# Patient Record
Sex: Male | Born: 1965 | Hispanic: Yes | Marital: Married | Smoking: Never smoker
Health system: Southern US, Community
[De-identification: ages and names within clinical notes are randomized; demographics above are authoritative.]

## PROBLEM LIST (undated history)

## (undated) DIAGNOSIS — I10 Essential (primary) hypertension: Secondary | ICD-10-CM

---

## 2008-07-28 ENCOUNTER — Emergency Department: Payer: Self-pay | Admitting: Emergency Medicine

## 2012-08-19 ENCOUNTER — Emergency Department: Payer: Self-pay | Admitting: Emergency Medicine

## 2019-01-24 ENCOUNTER — Encounter: Payer: Self-pay | Admitting: Emergency Medicine

## 2019-01-24 ENCOUNTER — Emergency Department
Admission: EM | Admit: 2019-01-24 | Discharge: 2019-01-24 | Disposition: A | Discharge status: Home / Self Care | Payer: BC Managed Care – PPO | Attending: Student in an Organized Health Care Education/Training Program | Admitting: Student in an Organized Health Care Education/Training Program

## 2019-01-24 ENCOUNTER — Other Ambulatory Visit: Payer: Self-pay

## 2019-01-24 ENCOUNTER — Emergency Department: Payer: BC Managed Care – PPO

## 2019-01-24 DIAGNOSIS — I10 Essential (primary) hypertension: Secondary | ICD-10-CM | POA: Diagnosis not present

## 2019-01-24 DIAGNOSIS — R42 Dizziness and giddiness: Secondary | ICD-10-CM | POA: Diagnosis present

## 2019-01-24 HISTORY — DX: Essential (primary) hypertension: I10

## 2019-01-24 LAB — DIFFERENTIAL
Abs Immature Granulocytes: 0.04 10*3/uL (ref 0.00–0.07)
Basophils Absolute: 0.1 10*3/uL (ref 0.0–0.1)
Basophils Relative: 1 %
Eosinophils Absolute: 0 10*3/uL (ref 0.0–0.5)
Eosinophils Relative: 0 %
Immature Granulocytes: 0 %
Lymphocytes Relative: 17 %
Lymphs Abs: 1.5 10*3/uL (ref 0.7–4.0)
Monocytes Absolute: 0.5 10*3/uL (ref 0.1–1.0)
Monocytes Relative: 5 %
Neutro Abs: 6.8 10*3/uL (ref 1.7–7.7)
Neutrophils Relative %: 77 %

## 2019-01-24 LAB — CBC
HCT: 46.2 % (ref 39.0–52.0)
Hemoglobin: 15.4 g/dL (ref 13.0–17.0)
MCH: 31.8 pg (ref 26.0–34.0)
MCHC: 33.3 g/dL (ref 30.0–36.0)
MCV: 95.3 fL (ref 80.0–100.0)
Platelets: 228 10*3/uL (ref 150–400)
RBC: 4.85 MIL/uL (ref 4.22–5.81)
RDW: 13.3 % (ref 11.5–15.5)
WBC: 8.9 10*3/uL (ref 4.0–10.5)
nRBC: 0 % (ref 0.0–0.2)

## 2019-01-24 LAB — COMPREHENSIVE METABOLIC PANEL
ALT: 20 U/L (ref 0–44)
AST: 17 U/L (ref 15–41)
Albumin: 4.2 g/dL (ref 3.5–5.0)
Alkaline Phosphatase: 62 U/L (ref 38–126)
Anion gap: 9 (ref 5–15)
BUN: 26 mg/dL — ABNORMAL HIGH (ref 6–20)
CO2: 25 mmol/L (ref 22–32)
Calcium: 9.2 mg/dL (ref 8.9–10.3)
Chloride: 104 mmol/L (ref 98–111)
Creatinine, Ser: 0.84 mg/dL (ref 0.61–1.24)
GFR calc Af Amer: >60 mL/min (ref >60)
GFR calc non Af Amer: >60 mL/min (ref >60)
Glucose, Bld: 136 mg/dL — ABNORMAL HIGH (ref 70–99)
Potassium: 3.9 mmol/L (ref 3.5–5.1)
Sodium: 138 mmol/L (ref 135–145)
Total Bilirubin: 0.5 mg/dL (ref 0.3–1.2)
Total Protein: 8.2 g/dL — ABNORMAL HIGH (ref 6.5–8.1)

## 2019-01-24 MED ORDER — MECLIZINE HCL 25 MG PO TABS: 25.0000 mg | ORAL_TABLET | Freq: Three times a day (TID) | ORAL | 0 refills | Status: DC | PRN

## 2019-01-24 MED ORDER — MECLIZINE HCL 25 MG PO TABS
25.0000 mg | ORAL_TABLET | Freq: Once | ORAL | Status: AC
  Administered 2019-01-24: 25 mg via ORAL
  Filled 2019-01-24: qty 1

## 2019-01-24 NOTE — ED Triage Notes (Signed)
Pt reports yesterday was working on his car and turned really quick and got dizzy. Pt states that he has had dizziness since then. Pt called his MD and was advised to come to the ED.

## 2019-01-24 NOTE — ED Notes (Signed)
Pt presents to ED via POV, states was seen at PCP office yesterday with c/o dizziness while working on his car. Pt states he saw his PCP and he was taught a move to help the dizziness because of a stone that was bouncing around in his head, pt states woke up this morning with sudden onset dizziness/nausea and had an episode of vomiting. Pt states dizziness worse with turning his head/movement at this time.

## 2019-01-24 NOTE — ED Provider Notes (Signed)
[facility] Emergency Department Provider Note    First MD Initiated Contact with Patient 01/24/19 1305     (approximate)  I have reviewed the triage vital signs and the nursing notes.   HISTORY  Chief Complaint Dizziness    HPI Darrell Scott is a 53 y.o. male the below listed past medical history presents the ER for over 1 day of intermittent positional dizziness as if he feels like he is spinning to space.  This started when he was working in his car lower back and turn his head quickly.  States has had episodes of this in the past.  States he went and saw his PCP yesterday was diagnosed with vertigo and instructed to do Epley maneuvers.  Had some improvement with this but had an episode this morning where he started feeling worse having associated nausea.  Does not have any vertiginous symptoms while at rest.  No pain.  No numbness or tingling.  No headaches.    Past Medical History:  Diagnosis Date  . Hypertension    No family history on file. History reviewed. No pertinent surgical history. There are no problems to display for this patient.     Prior to Admission medications   Medication Sig Start Date End Date Taking? Authorizing Provider  meclizine (ANTIVERT) 25 MG tablet Take 1 tablet (25 mg total) by mouth 3 (three) times daily as needed for dizziness. 01/24/19   Merlyn Lot, MD    Allergies Patient has no allergy information on record.    Social History Social History   Tobacco Use  . Smoking status: Not on file  Substance Use Topics  . Alcohol use: Not on file  . Drug use: Not on file    Review of Systems Patient denies headaches, rhinorrhea, blurry vision, numbness, shortness of breath, chest pain, edema, cough, abdominal pain, nausea, vomiting, diarrhea, dysuria, fevers, rashes or hallucinations unless otherwise stated above in HPI. ____________________________________________   PHYSICAL EXAM:  VITAL SIGNS: Vitals:     01/24/19 1146 01/24/19 1310  BP: (!) 146/95 137/85  Pulse: 66 (!) 56  Resp: 20 18  Temp: 98.1 F (36.7 C)   SpO2: 96% 94%    Constitutional: Alert and oriented.  Eyes: Conjunctivae are normal.  Head: Atraumatic. Nose: No congestion/rhinnorhea. Mouth/Throat: Mucous membranes are moist.   Neck: No stridor. Painless ROM.  Cardiovascular: Normal rate, regular rhythm. Grossly normal heart sounds.  Good peripheral circulation. Respiratory: Normal respiratory effort.  No retractions. Lungs CTAB. Gastrointestinal: Soft and nontender. No distention. No abdominal bruits. No CVA tenderness. Genitourinary:  Musculoskeletal: No lower extremity tenderness nor edema.  No joint effusions. Neurologic:  CN- intact.  No facial droop, Normal FNF.  Normal heel to shin.  Sensation intact bilaterally. Normal speech and language. No gross focal neurologic deficits are appreciated. No gait instability.  Benign hints exam. Skin:  Skin is warm, dry and intact. No rash noted. Psychiatric: Mood and affect are normal. Speech and behavior are normal.  ____________________________________________   LABS (all labs ordered are listed, but only abnormal results are displayed)  Results for orders placed or performed during the hospital encounter of 01/24/19 (from the past 24 hour(s))  CBC     Status: None   Collection Time: 01/24/19 11:47 AM  Result Value Ref Range   WBC 8.9 4.0 - 10.5 K/uL   RBC 4.85 4.22 - 5.81 MIL/uL   Hemoglobin 15.4 13.0 - 17.0 g/dL   HCT 46.2 39.0 - 52.0 %  MCV 95.3 80.0 - 100.0 fL   MCH 31.8 26.0 - 34.0 pg   MCHC 33.3 30.0 - 36.0 g/dL   RDW 24.413.3 01.011.5 - 27.215.5 %   Platelets 228 150 - 400 K/uL   nRBC 0.0 0.0 - 0.2 %  Differential     Status: None   Collection Time: 01/24/19 11:47 AM  Result Value Ref Range   Neutrophils Relative % 77 %   Neutro Abs 6.8 1.7 - 7.7 K/uL   Lymphocytes Relative 17 %   Lymphs Abs 1.5 0.7 - 4.0 K/uL   Monocytes Relative 5 %   Monocytes Absolute 0.5  0.1 - 1.0 K/uL   Eosinophils Relative 0 %   Eosinophils Absolute 0.0 0.0 - 0.5 K/uL   Basophils Relative 1 %   Basophils Absolute 0.1 0.0 - 0.1 K/uL   Immature Granulocytes 0 %   Abs Immature Granulocytes 0.04 0.00 - 0.07 K/uL  Comprehensive metabolic panel     Status: Abnormal   Collection Time: 01/24/19 11:47 AM  Result Value Ref Range   Sodium 138 135 - 145 mmol/L   Potassium 3.9 3.5 - 5.1 mmol/L   Chloride 104 98 - 111 mmol/L   CO2 25 22 - 32 mmol/L   Glucose, Bld 136 (H) 70 - 99 mg/dL   BUN 26 (H) 6 - 20 mg/dL   Creatinine, Ser 5.360.84 0.61 - 1.24 mg/dL   Calcium 9.2 8.9 - 64.410.3 mg/dL   Total Protein 8.2 (H) 6.5 - 8.1 g/dL   Albumin 4.2 3.5 - 5.0 g/dL   AST 17 15 - 41 U/L   ALT 20 0 - 44 U/L   Alkaline Phosphatase 62 38 - 126 U/L   Total Bilirubin 0.5 0.3 - 1.2 mg/dL   GFR calc non Af Amer >60 >60 mL/min   GFR calc Af Amer >60 >60 mL/min   Anion gap 9 5 - 15   ____________________________________________  EKG My review and personal interpretation at Time: 11:43   Indication: dizziness  Rate: 70  Rhythm: sinus Axis: normal Other: normal intervals, no stemi ____________________________________________  RADIOLOGY  I personally reviewed all radiographic images ordered to evaluate for the above acute complaints and reviewed radiology reports and findings.  These findings were personally discussed with the patient.  Please see medical record for radiology report.  ____________________________________________   PROCEDURES  Procedure(s) performed:  Procedures    Critical Care performed: no ____________________________________________   INITIAL IMPRESSION / ASSESSMENT AND PLAN / ED COURSE  Pertinent labs & imaging results that were available during my care of the patient were reviewed by me and considered in my medical decision making (see chart for details).   DDX: Vertigo, dehydration, electrolyte normality, orthostasis, CVA, TIA, dysrhythmia  Darrell Scott is a 53  y.o. who presents to the ED with symptoms as described above.  Patient well-appearing.  Exam and history is consistent with peripheral vertigo.  CT imaging and blood work were triage is reassuring.  Not consistent with TIA or stroke.  Patient be given meclizine.  Discussed conservative management and outpatient follow-up.  Have discussed with the patient and available family all diagnostics and treatments performed thus far and all questions were answered to the best of my ability. The patient demonstrates understanding and agreement with plan.      The patient was evaluated in Emergency Department today for the symptoms described in the history of present illness. He/she was evaluated in the context of the global COVID-19 pandemic, which necessitated consideration  that the patient might be at risk for infection with the SARS-CoV-2 virus that causes COVID-19. Institutional protocols and algorithms that pertain to the evaluation of patients at risk for COVID-19 are in a state of rapid change based on information released by regulatory bodies including the CDC and federal and state organizations. These policies and algorithms were followed during the patient's care in the ED.  As part of my medical decision making, I reviewed the following data within the electronic MEDICAL RECORD NUMBER Nursing notes reviewed and incorporated, Labs reviewed, notes from prior ED visits and [place] Controlled Substance Database   ____________________________________________   FINAL CLINICAL IMPRESSION(S) / ED DIAGNOSES  Final diagnoses:  Vertigo      NEW MEDICATIONS STARTED DURING THIS VISIT:  New Prescriptions   MECLIZINE (ANTIVERT) 25 MG TABLET    Take 1 tablet (25 mg total) by mouth 3 (three) times daily as needed for dizziness.     Note:  This document was prepared using Dragon voice recognition software and may include unintentional dictation errors.    Willy Eddy, MD 01/24/19 1344

## 2021-01-09 IMAGING — CT CT HEAD W/O CM
3 series · 16 of 47 positions shown, 19 images · non-contrast
Comparison: None.

CLINICAL DATA: Dizziness for 1 day

EXAM:
CT HEAD WITHOUT CONTRAST
TECHNIQUE: Contiguous axial images were obtained from the base of the skull
through the vertex without intravenous contrast.

[Series 2: head wo · axial · 0.44mm/px · z∈[-127,-2]mm · 10 of 30 slices shown, 13 images]
[im 3/30  brain]
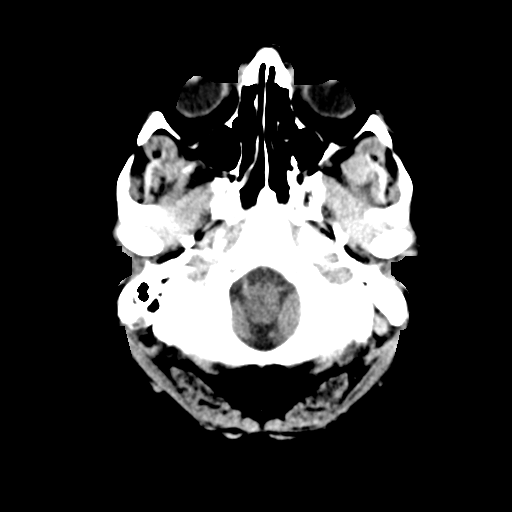
[im 3/30  bone]
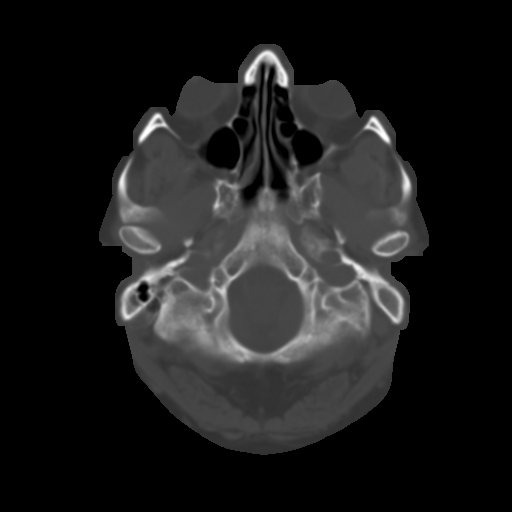
[im 6/30  brain]
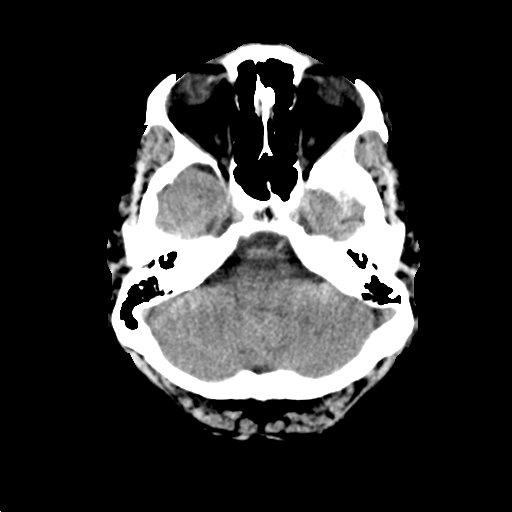
[im 9/30  brain]
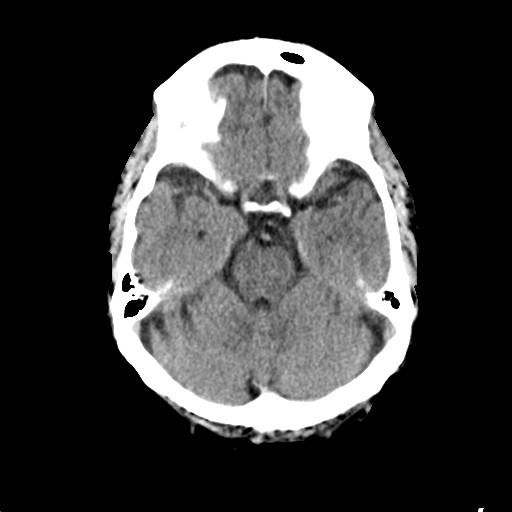
[im 11/30  brain]
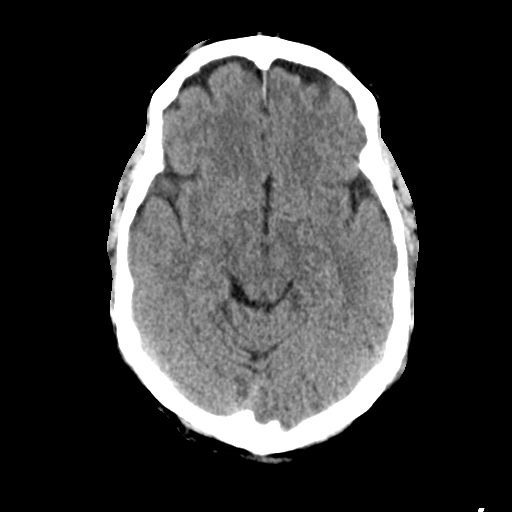
[im 14/30  brain]
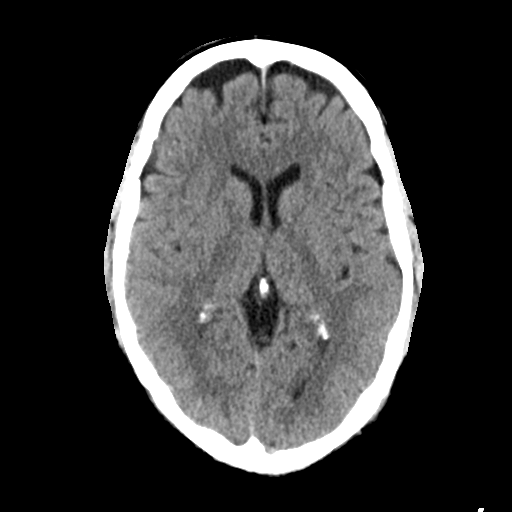
[im 14/30  bone]
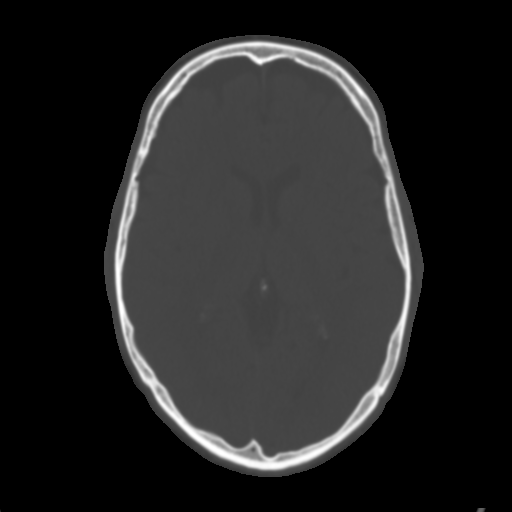
[im 17/30  brain]
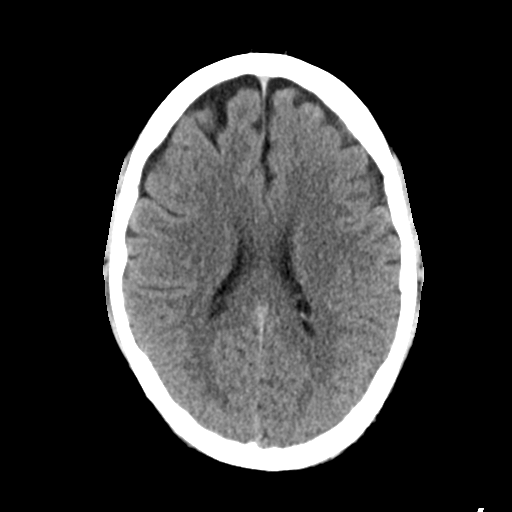
[im 20/30  brain]
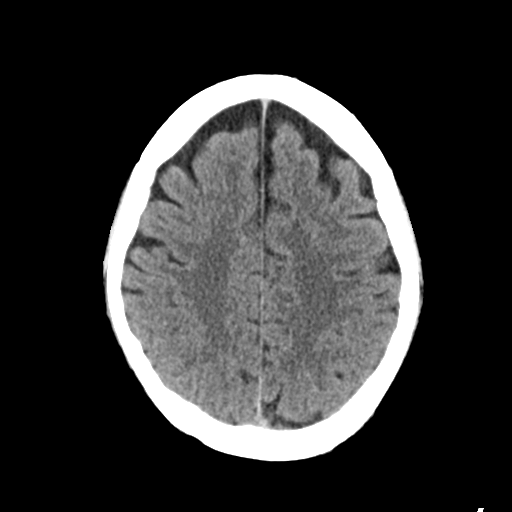
[im 23/30  brain]
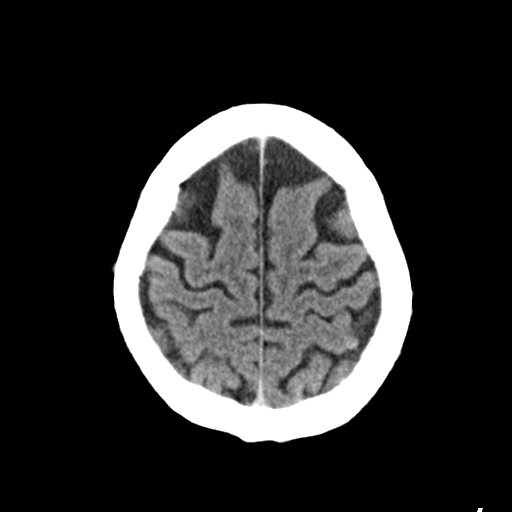
[im 25/30  brain]
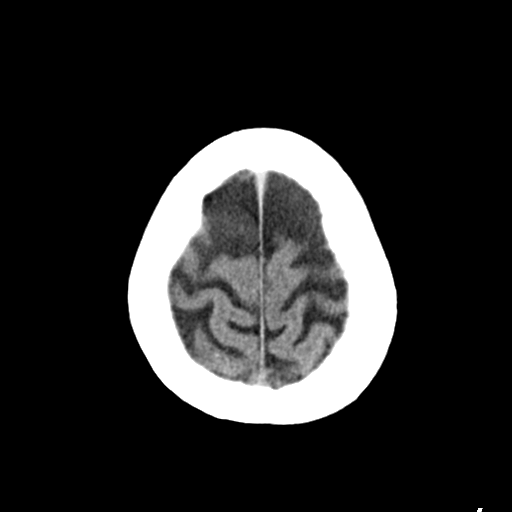
[im 25/30  bone]
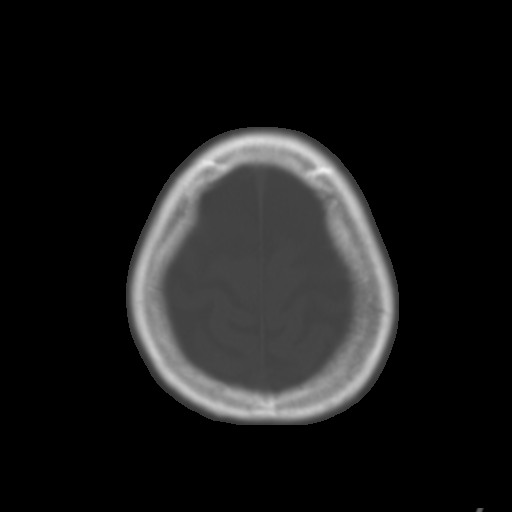
[im 28/30  brain]
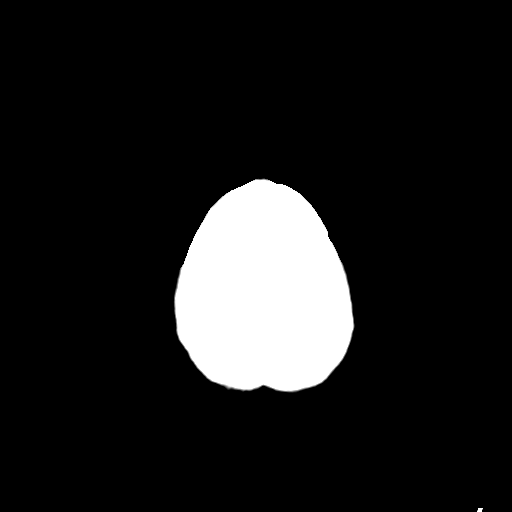

[Series 4: coronal soft tissue · coronal · 0.33mm/px · 3 of 68 slices shown]
[im 23/68  brain]
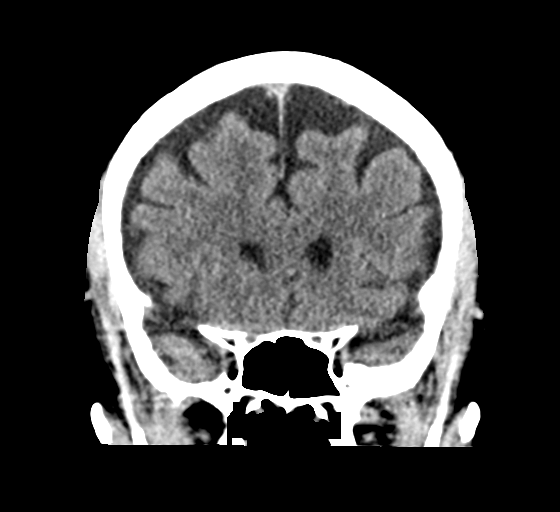
[im 30/68  brain]
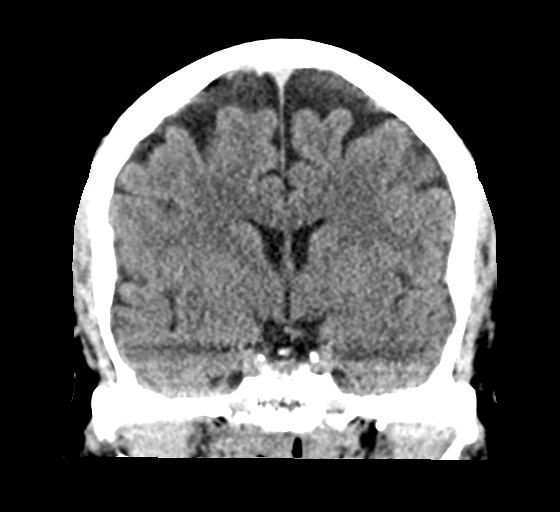
[im 38/68  brain]
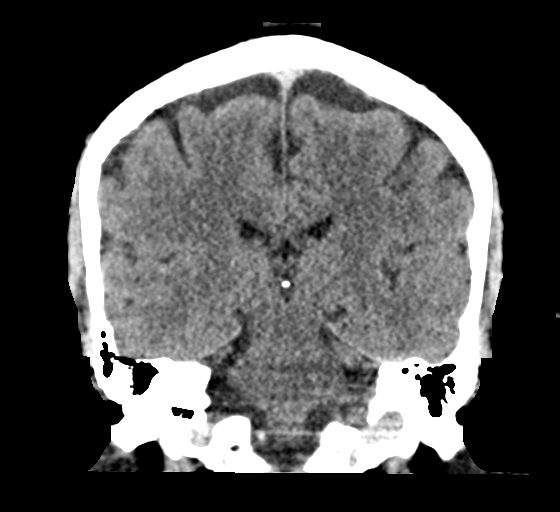

[Series 5: sagittal soft tissue · sagittal · 0.35mm/px · 3 of 55 slices shown]
[im 19/55  brain]
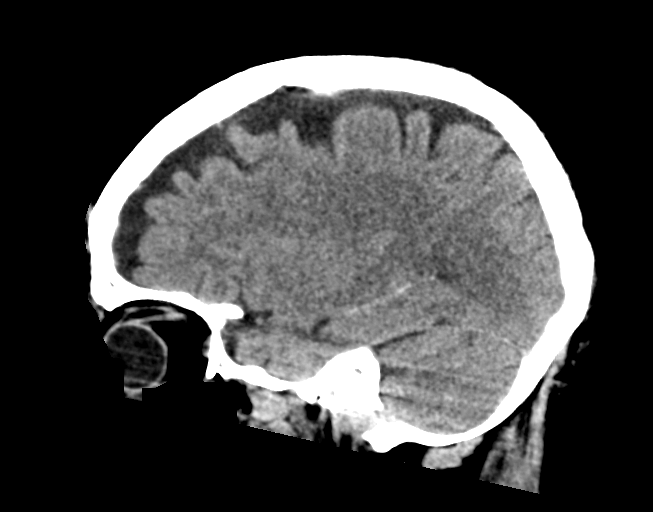
[im 28/55  brain]
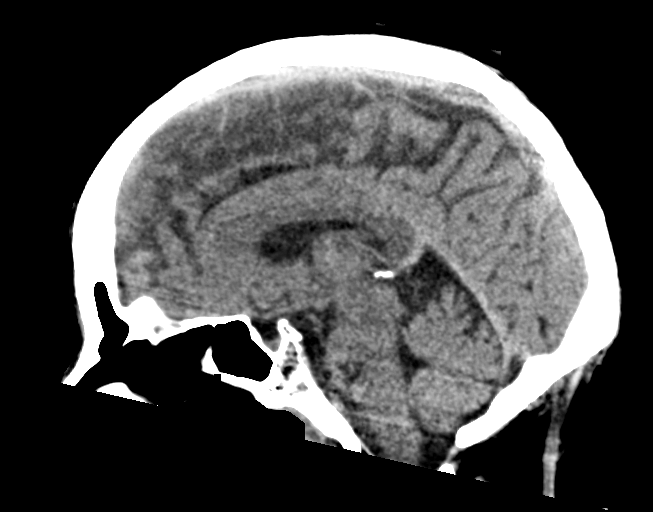
[im 37/55  brain]
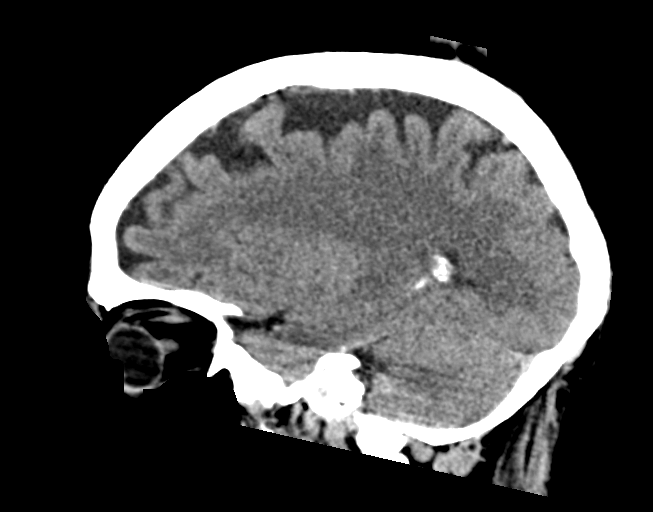

[16 of 47 positions shown; findings below may reference images not displayed]

FINDINGS: Brain: Ventricles are normal in size and configuration. There is
mild frontal atrophy, however. There is no intracranial mass,
hemorrhage, extra-axial fluid collection, or midline shift. Brain
parenchyma appears unremarkable. No evident acute infarct.

Vascular: No hyperdense vessels. No appreciable vascular
calcification.

Skull: Bony calvarium appears intact.

Sinuses/Orbits: Visualized paranasal sinuses are clear. Visualized
orbits appear symmetric bilaterally.

Other: Mastoid air cells are clear.
IMPRESSION: Slight frontal atrophy bilaterally. Ventricles are normal in size
and configuration. Brain parenchyma appears unremarkable. No mass or
hemorrhage. Study otherwise unremarkable.

## 2021-05-28 ENCOUNTER — Encounter: Payer: Self-pay | Admitting: Emergency Medicine

## 2021-05-28 ENCOUNTER — Other Ambulatory Visit: Payer: Self-pay

## 2021-05-28 ENCOUNTER — Emergency Department
Admission: EM | Admit: 2021-05-28 | Discharge: 2021-05-28 | Disposition: A | Discharge status: Home / Self Care | Payer: BC Managed Care – PPO | Attending: Emergency Medicine | Admitting: Emergency Medicine

## 2021-05-28 DIAGNOSIS — R42 Dizziness and giddiness: Secondary | ICD-10-CM | POA: Diagnosis present

## 2021-05-28 LAB — CBG MONITORING, ED: Glucose-Capillary: 193 mg/dL — ABNORMAL HIGH (ref 70–99)

## 2021-05-28 LAB — CBC
HCT: 48.2 % (ref 39.0–52.0)
Hemoglobin: 15.7 g/dL (ref 13.0–17.0)
MCH: 31.5 pg (ref 26.0–34.0)
MCHC: 32.6 g/dL (ref 30.0–36.0)
MCV: 96.6 fL (ref 80.0–100.0)
Platelets: 199 10*3/uL (ref 150–400)
RBC: 4.99 MIL/uL (ref 4.22–5.81)
RDW: 12.7 % (ref 11.5–15.5)
WBC: 5.3 10*3/uL (ref 4.0–10.5)
nRBC: 0 % (ref 0.0–0.2)

## 2021-05-28 LAB — URINALYSIS, ROUTINE W REFLEX MICROSCOPIC
Bilirubin Urine: NEGATIVE
Glucose, UA: NEGATIVE mg/dL
Hgb urine dipstick: NEGATIVE
Ketones, ur: NEGATIVE mg/dL
Leukocytes,Ua: NEGATIVE
Nitrite: NEGATIVE
Protein, ur: NEGATIVE mg/dL
Specific Gravity, Urine: 1.013 (ref 1.005–1.030)
pH: 5 (ref 5.0–8.0)

## 2021-05-28 LAB — BASIC METABOLIC PANEL
Anion gap: 5 (ref 5–15)
BUN: 21 mg/dL — ABNORMAL HIGH (ref 6–20)
CO2: 26 mmol/L (ref 22–32)
Calcium: 9.2 mg/dL (ref 8.9–10.3)
Chloride: 107 mmol/L (ref 98–111)
Creatinine, Ser: 0.74 mg/dL (ref 0.61–1.24)
GFR, Estimated: >60 mL/min (ref >60)
Glucose, Bld: 190 mg/dL — ABNORMAL HIGH (ref 70–99)
Potassium: 3.6 mmol/L (ref 3.5–5.1)
Sodium: 138 mmol/L (ref 135–145)

## 2021-05-28 MED ORDER — DIAZEPAM 5 MG PO TABS
5.0000 mg | ORAL_TABLET | Freq: Once | ORAL | Status: AC
  Administered 2021-05-28: 5 mg via ORAL
  Filled 2021-05-28: qty 1

## 2021-05-28 MED ORDER — MECLIZINE HCL 25 MG PO TABS
12.5000 mg | ORAL_TABLET | Freq: Once | ORAL | Status: AC
  Administered 2021-05-28: 12.5 mg via ORAL
  Filled 2021-05-28: qty 1

## 2021-05-28 MED ORDER — DIAZEPAM 5 MG PO TABS: 5.0000 mg | ORAL_TABLET | Freq: Two times a day (BID) | ORAL | 0 refills | Status: AC | PRN

## 2021-05-28 MED ORDER — DEXAMETHASONE 4 MG PO TABS
10.0000 mg | ORAL_TABLET | Freq: Once | ORAL | Status: AC
  Administered 2021-05-28: 10 mg via ORAL
  Filled 2021-05-28: qty 3

## 2021-05-28 MED ORDER — MECLIZINE HCL 25 MG PO TABS: 25.0000 mg | ORAL_TABLET | Freq: Three times a day (TID) | ORAL | 0 refills | Status: AC | PRN

## 2021-05-28 NOTE — ED Provider Notes (Signed)
? ?[facility] ?Provider Note ? ? ? Event Date/Time  ? First MD Initiated Contact with Patient 05/28/21 1151   ?  (approximate) ? ? ?History  ? ?Dizziness ? ? ?HPI ? ?Darrell Scott is a 56 y.o. male with past medical history of peripheral vertigo here with dizziness.  The patient states that for the last 2 to 3 days, he has had progressively worsening generalized dizziness.  He states this is generally worse when he wakes up first thing in the morning.  He states that then improves on the day.  He notices that particularly when he looks to his left.  He has had a history of vertigo with similar symptoms, but has been sometime since his last episode.  Denies any ear ringing.  No recent illnesses.  No sinus congestion.  No recent hearing changes.  No recent head trauma.  No other medical complaints.  Specifically, no focal numbness, weakness, or other issues. ?  ? ? ?Physical Exam  ? ?Triage Vital Signs: ?ED Triage Vitals  ?Enc Vitals Group  ?   BP 05/28/21 1125 (!) 133/94  ?   Pulse Rate 05/28/21 1125 66  ?   Resp 05/28/21 1125 16  ?   Temp 05/28/21 1125 98 ?F (36.7 ?C)  ?   Temp Source 05/28/21 1125 Oral  ?   SpO2 05/28/21 1125 94 %  ?   Weight 05/28/21 1053 240 lb 1.3 oz (108.9 kg)  ?   Height 05/28/21 1053 5\' 8"  (1.727 m)  ?   Head Circumference --   ?   Peak Flow --   ?   Pain Score 05/28/21 1053 0  ?   Pain Loc --   ?   Pain Edu? --   ?   Excl. in GC? --   ? ? ?Most recent vital signs: ?Vitals:  ? 05/28/21 1125 05/28/21 1255  ?BP: (!) 133/94 123/71  ?Pulse: 66 (!) 58  ?Resp: 16 16  ?Temp: 98 ?F (36.7 ?C)   ?SpO2: 94% 92%  ? ? ? ?General: Awake, no distress.  ?CV:  Good peripheral perfusion.  ?Resp:  Normal effort.  ?Abd:  No distention.  ?Other:  HEENT exam unremarkable, with clear oropharynx.  Tympanic membranes normal bilaterally.  No effusions.  No sinus tenderness or swelling. ? ?Neurological Exam:  ?Mental Status: Alert and oriented to person, place, and time. Attention and concentration  normal. Speech clear. Recent memory is intact. ?Cranial Nerves: Visual fields grossly intact. EOMI and PERRLA. No nystagmus noted. Facial sensation intact at forehead, maxillary cheek, and chin/mandible bilaterally. No facial asymmetry or weakness. Hearing grossly normal. Uvula is midline, and palate elevates symmetrically. Normal SCM and trapezius strength. Tongue midline without fasciculations. ?Motor: Muscle strength 5/5 in proximal and distal UE and LE bilaterally. No pronator drift. Muscle tone normal. ?Sensation: Intact to light touch in upper and lower extremities distally bilaterally.  ?Gait: Normal ?Coordination: Normal FTN bilaterally. ? ? ? ? ?ED Results / Procedures / Treatments  ? ?Labs ?(all labs ordered are listed, but only abnormal results are displayed) ?Labs Reviewed  ?BASIC METABOLIC PANEL - Abnormal; Notable for the following components:  ?    Result Value  ? Glucose, Bld 190 (*)   ? BUN 21 (*)   ? All other components within normal limits  ?URINALYSIS, ROUTINE W REFLEX MICROSCOPIC - Abnormal; Notable for the following components:  ? Color, Urine YELLOW (*)   ? APPearance CLEAR (*)   ? All  other components within normal limits  ?CBG MONITORING, ED - Abnormal; Notable for the following components:  ? Glucose-Capillary 193 (*)   ? All other components within normal limits  ?CBC  ? ? ? ?EKG ?Normal sinus rhythm, ventricular rate 65.  PR 144, QRS 94, QTc 430.  No acute ST elevations or depressions.  No EKG evidence of acute ischemia or infarct. ? ? ?RADIOLOGY ? ? ? ?I also independently reviewed and agree with radiologist interpretations. ? ? ?PROCEDURES: ? ?Critical Care performed: No ? ? ?MEDICATIONS ORDERED IN ED: ?Medications  ?diazepam (VALIUM) tablet 5 mg (5 mg Oral Given 05/28/21 1256)  ?meclizine (ANTIVERT) tablet 12.5 mg (12.5 mg Oral Given 05/28/21 1256)  ? ? ? ?IMPRESSION / MDM / ASSESSMENT AND PLAN / ED COURSE  ?I reviewed the triage vital signs and the nursing notes. ?             ?                ? ?Ddx:  ?Peripheral vertigo, central vertigo, orthostasis, dehydration, arrhythmia ? ? ?MDM:  ?Well-appearing 56 year old male here with what appears to be peripheral vertigo.  Patient has a history of the same.  On exam, he has very reproducible positional vertigo with fatigued, unidirectional nystagmus when looking left.  He has a history of similar symptoms.  He has absolutely no dysarthria, dysphagia, or signs of cerebellar or central etiology.  No recent trauma.  His screening lab work obtained in triage reviewed.  No significant or maladies.  Leukocytosis or anemia.  BMP is normal with normal electrolytes.  Urinalysis negative.  EKG shows normal sinus rhythm without ischemic changes.  No chest pain. ? ?Patient put through Dix-Hallpike maneuvers with mild improvement.  He feels improved after Valium and meclizine.  Will discharge with supportive care for peripheral vertigo and outpatient follow-up.  Return precautions given. ? ? ?MEDICATIONS GIVEN IN ED: ?Medications  ?diazepam (VALIUM) tablet 5 mg (5 mg Oral Given 05/28/21 1256)  ?meclizine (ANTIVERT) tablet 12.5 mg (12.5 mg Oral Given 05/28/21 1256)  ? ? ? ?Consults:  ?None ? ? ?EMR reviewed  ?Prior ED notes reviewed ? ? ? ? ?FINAL CLINICAL IMPRESSION(S) / ED DIAGNOSES  ? ?Final diagnoses:  ?Vertigo  ? ? ? ?Rx / DC Orders  ? ?ED Discharge Orders   ? ?      Ordered  ?  meclizine (ANTIVERT) 25 MG tablet  3 times daily PRN       ? 05/28/21 1338  ?  diazepam (VALIUM) 5 MG tablet  Every 12 hours PRN       ? 05/28/21 1338  ? ?  ?  ? ?  ? ? ? ?Note:  This document was prepared using Dragon voice recognition software and may include unintentional dictation errors. ?  ?Shaune Pollack, MD ?05/28/21 1338 ? ?

## 2021-05-28 NOTE — ED Triage Notes (Signed)
Pt comes into the ED via POV c/o dizziness that started Monday.  Pt states he has a h/o vertigo and has an old Rx for meclizine.  Pt states when he stands up the room spins.  Pt ambulatory to triage at this time with even and unlabored respirations.  Denies any weakness, but has had one episode of N/V.  ?

## 2021-05-28 NOTE — ED Notes (Signed)
E-signature pad not functional. 

## 2021-05-28 NOTE — ED Notes (Signed)
Patient wakes easily and sat up independently and without difficulty. ?

## 2021-05-28 NOTE — ED Notes (Signed)
Patient ambulated in hallway with a steady gait at a good pace. Patient states he is sleepy, but only a little bit dizzy. MD aware. ?

## 2023-10-14 ENCOUNTER — Other Ambulatory Visit: Payer: Self-pay

## 2023-10-14 DIAGNOSIS — Z23 Encounter for immunization: Secondary | ICD-10-CM | POA: Insufficient documentation

## 2023-10-14 DIAGNOSIS — R2243 Localized swelling, mass and lump, lower limb, bilateral: Secondary | ICD-10-CM | POA: Diagnosis present

## 2023-10-14 DIAGNOSIS — R600 Localized edema: Secondary | ICD-10-CM | POA: Insufficient documentation

## 2023-10-14 LAB — BASIC METABOLIC PANEL WITH GFR
Anion gap: 11 (ref 5–15)
BUN: 29 mg/dL — ABNORMAL HIGH (ref 6–20)
CO2: 25 mmol/L (ref 22–32)
Calcium: 9.2 mg/dL (ref 8.9–10.3)
Chloride: 103 mmol/L (ref 98–111)
Creatinine, Ser: 0.84 mg/dL (ref 0.61–1.24)
GFR, Estimated: >60 mL/min (ref >60)
Glucose, Bld: 111 mg/dL — ABNORMAL HIGH (ref 70–99)
Potassium: 3.8 mmol/L (ref 3.5–5.1)
Sodium: 139 mmol/L (ref 135–145)

## 2023-10-14 LAB — CBC WITH DIFFERENTIAL/PLATELET
Abs Immature Granulocytes: 0.27 K/uL — ABNORMAL HIGH (ref 0.00–0.07)
Basophils Absolute: 0.1 K/uL (ref 0.0–0.1)
Basophils Relative: 1 %
Eosinophils Absolute: 0.5 K/uL (ref 0.0–0.5)
Eosinophils Relative: 6 %
HCT: 44.1 % (ref 39.0–52.0)
Hemoglobin: 14.5 g/dL (ref 13.0–17.0)
Immature Granulocytes: 3 %
Lymphocytes Relative: 28 %
Lymphs Abs: 2.2 K/uL (ref 0.7–4.0)
MCH: 31.5 pg (ref 26.0–34.0)
MCHC: 32.9 g/dL (ref 30.0–36.0)
MCV: 95.7 fL (ref 80.0–100.0)
Monocytes Absolute: 0.9 K/uL (ref 0.1–1.0)
Monocytes Relative: 12 %
Neutro Abs: 3.9 K/uL (ref 1.7–7.7)
Neutrophils Relative %: 50 %
Platelets: 230 K/uL (ref 150–400)
RBC: 4.61 MIL/uL (ref 4.22–5.81)
RDW: 13.3 % (ref 11.5–15.5)
WBC: 7.8 K/uL (ref 4.0–10.5)
nRBC: 0 % (ref 0.0–0.2)

## 2023-10-14 NOTE — ED Triage Notes (Signed)
 Patient brought in by POV for bil leg swelling worse in L leg. Pt reports leg swelling since 2023, denies worsening swelling over last few days/weeks. Pt also reports bil knee pain and abrasion to L calf that has not healed x3 months. Pt declined interpreter in triage, aware interpreter is available at any point. AxOx4.

## 2023-10-15 ENCOUNTER — Emergency Department
Admission: EM | Admit: 2023-10-15 | Discharge: 2023-10-15 | Disposition: A | Discharge status: Home / Self Care | Attending: Emergency Medicine | Admitting: Emergency Medicine

## 2023-10-15 DIAGNOSIS — R600 Localized edema: Secondary | ICD-10-CM

## 2023-10-15 MED ORDER — TETANUS-DIPHTH-ACELL PERTUSSIS 5-2.5-18.5 LF-MCG/0.5 IM SUSY
0.5000 mL | PREFILLED_SYRINGE | Freq: Once | INTRAMUSCULAR | Status: AC
  Administered 2023-10-15: 0.5 mL via INTRAMUSCULAR
  Filled 2023-10-15: qty 0.5

## 2023-10-15 NOTE — Discharge Instructions (Signed)
 We recommend you follow-up with your primary care doctor for management of your medical issues, but we also provided follow-up information for vascular surgery to discuss your ongoing leg swelling and issues with your veins.  Please call the office and request a follow-up appointment with Dr. Marea or one of his colleagues.  We also recommend you call the office of Dr. Edie to schedule an appointment with him or one of his orthopedics colleagues to discuss the issues with chronic pain in your knees.  We updated your tetanus vaccination tonight and you do not need antibiotics at this time.

## 2023-10-15 NOTE — ED Provider Notes (Signed)
 [facility] Provider Note    Event Date/Time   First MD Initiated Contact with Patient 10/15/23 0007     (approximate)   History   Leg Swelling  The patient and/or family speak(s) Spanish.  They understand they have the right to the use of a hospital interpreter, however at this time they prefer to speak directly with me in English (his preference).  They know that they can ask for an interpreter at any time.   HPI Darrell Scott is a 58 y.o. male who presents for evaluation of chronic swelling in both of his legs, left worse than right.  He said this has been going on for a couple of years.  He is concerned because he has had multiple ultrasounds in the past that showed no blood clots (as recently as the last couple of weeks to months), but his legs are still swollen.  He said they are always worse at the end of the day after he works and better after he lets them rest and be elevated.  [place] in [place] who told him that one of his leg veins in the left leg is not good and needs to be taken out.  He has seen his primary care doctor locally but not for quite a while and he said that they do not do anything for him other than check some labs.  He is also concerned because he has some abrasions on his shins that are not healing and he wanted to make sure they were not infected.  He has a small abrasion that has been there for weeks and he also has concerns because he has not had a tetanus shot for many years.  His other concern is bilateral knee pain.  He has had x-rays in the past and has had cortisone injections but it does not help and he would like to speak with a specialist about how to help his bone-on-bone knee pain.     Physical Exam   Triage Vital Signs: ED Triage Vitals  Encounter Vitals Group     BP 10/14/23 2025 (!) 134/95     Girls Systolic BP Percentile --      Girls Diastolic BP Percentile --      Boys Systolic BP Percentile --       Boys Diastolic BP Percentile --      Pulse Rate 10/14/23 2025 63     Resp 10/14/23 2025 18     Temp 10/14/23 2025 98.3 F (36.8 C)     Temp Source 10/14/23 2025 Oral     SpO2 10/14/23 2025 100 %     Weight 10/14/23 2035 124.7 kg (275 lb)     Height 10/14/23 2035 1.702 m (5' 7)     Head Circumference --      Peak Flow --      Pain Score 10/14/23 2026 0     Pain Loc --      Pain Education --      Exclude from Growth Chart --     Most recent vital signs: Vitals:   10/15/23 0009 10/15/23 0059  BP:  135/82  Pulse:  (!) 55  Resp:  18  Temp:  97.8 F (36.6 C)  SpO2: 100% 98%    General: Awake, no obvious distress. CV:  Good peripheral perfusion.  Resp:  Normal effort. Speaking easily and comfortably, no accessory muscle usage nor intercostal retractions.   Abd:  No  distention.  Other:  Patient has 1+ pitting edema in bilateral lower extremities, left slightly greater than right which he says is normal for him.  He has some very mild superficial abrasions with no evidence of surrounding cellulitis.  No tenderness to palpation.  He has a small relatively superficial circular chronic appearing wound on the distal left anterior shin with no evidence of surrounding infection.  There is no weeping from his legs and no evidence of active infection.   ED Results / Procedures / Treatments   Labs (all labs ordered are listed, but only abnormal results are displayed) Labs Reviewed  CBC WITH DIFFERENTIAL/PLATELET - Abnormal; Notable for the following components:      Result Value   Abs Immature Granulocytes 0.27 (*)    All other components within normal limits  BASIC METABOLIC PANEL WITH GFR - Abnormal; Notable for the following components:   Glucose, Bld 111 (*)    BUN 29 (*)    All other components within normal limits       PROCEDURES:  Critical Care performed: No  Procedures    IMPRESSION / MDM / ASSESSMENT AND PLAN / ED COURSE  I reviewed the triage vital signs and  the nursing notes.                              Differential diagnosis includes, but is not limited to, peripheral edema, peripheral vascular disease, venous stasis, cellulitis, DVT.  Patient's presentation is most consistent with exacerbation of chronic illness.   Interventions/Medications given:  Medications  Tdap (BOOSTRIX) injection 0.5 mL (0.5 mLs Intramuscular Given 10/15/23 0039)    (Note:  hospital course my include additional interventions and/or labs/studies not listed above.)  I reviewed the medical record and it corresponds with what the patient said.  He has had multiple ultrasounds in the past that showed no DVT.  His evaluation, signs and symptoms are most consistent with peripheral vascular disease but no active infection and no acute issue tonight.  He is most concerned about his knee pain as well as the chronic swelling.  I considered referral to heart failure clinic but I think vascular would be a better choice for him.  I explained this and he will call and schedule follow-up appointment.  I have also given him information about orthopedic surgery because he has some specific questions about gel injections into his knees and other advanced interventions that are beyond my scope.  He would like a Tdap injection tonight and I think that is reasonable but he is comfortable without antibiotics when I reassured him that he has no evidence of active infection.  He will follow-up as an outpatient and I gave my usual and customary follow-up recommendations and return precautions       FINAL CLINICAL IMPRESSION(S) / ED DIAGNOSES   Final diagnoses:  Peripheral edema     Rx / DC Orders   ED Discharge Orders     None        Note:  This document was prepared using Dragon voice recognition software and may include unintentional dictation errors.   Gordan Huxley, MD 10/15/23 [phone]

## 2023-10-28 NOTE — Progress Notes (Addendum)
 Chief Complaint Chief Complaint  Patient presents with  . Right Knee - Pain  . Left Knee - Pain    Reason for Visit The patient is a 58 year old male who presented for complaints of bilateral knee pain.  For many years now he has been having bilateral knee pain and has been told he has osteoarthritis that would require knee replacement surgery at some point.  In 2022 he had x-rays that showed mild narrowing but no complete arthritis.  He was told that cortisone injections might help.  He has continued to have pain with stiffness and swelling in his right leg more than the left.  He has more pain with activities and walking.  He wears knee braces that do seem to help both knees.  He is interested in and talking about knee replacement surgery.  The patient continues to work.  The patient uses diclofenac and occasional naproxen.   Medications Current Outpatient Medications  Medication Sig Dispense Refill  . hydroCHLOROthiazide (HYDRODIURIL) 25 MG tablet     . lisinopriL (ZESTRIL) 10 MG tablet lisinopril 10 mg tablet    . naproxen (NAPROSYN) 500 MG tablet Take 1 tablet (500 mg total) by mouth every 12 (twelve) hours With food for pain.  No ibuprofen or Advil with it. 20 tablet 0  . traMADoL (ULTRAM) 50 mg tablet Take 1 tablet (50 mg total) by mouth every 6 (six) hours as needed for Pain for up to 30 doses 30 tablet 0   No current facility-administered medications for this visit.    Allergies Patient has no known allergies.  Histories Past Medical History: Past Medical History:  Diagnosis Date  . Hypertension     Past Surgical History: History reviewed. No pertinent surgical history.  Social History: Social History   Socioeconomic History  . Marital status: Married  Tobacco Use  . Smoking status: Former    Types: Cigarettes  . Smokeless tobacco: Never  Vaping Use  . Vaping status: Never Used  Substance and Sexual Activity  . Alcohol use: Yes    Alcohol/week: 8.0 standard  drinks of alcohol    Types: 6 Cans of beer, 2 Shots of liquor per week  . Drug use: Never    Family History: No family history on file.  Review of Systems A comprehensive 14 point ROS was performed, reviewed, and the pertinent orthopaedic findings are documented in the HPI.  Exam BP 134/78   Ht 172.7 cm (5' 8)   Wt (!) 104.3 kg (230 lb)   BMI 34.97 kg/m    General/Constitutional: NAD, conversant Eyes: Pupils equal and round, extraocular movements intact ENT: atraumatic external nose and ears, moist mucous membranes Respiratory: non-labored breathing, symmetric chest rise, chest sounds clear. Cardiovascular: no visible lower extremity edema, peripheral pulses present, regular rate and rhythm  Skin: normal skin turgor, warm and dry Neurological: cranial nerves grossly intact, sensation grossly intact Psychological:  Appropriate mood and affect; appropriate judgment Musculoskeletal: as detailed below:  General: Well developed, well nourished 58 y.o. male in no apparent distress.  Normal affect.  Normal communication.  Patient answers questions appropriately.  The patient has a slow shuffling gait.  There is no antalgic component.  There is no hip lurch.    Bilateral lower Extremities: Examination of the bilateral lower extremity reveals no bony abnormality, no edema, no effusion and no ecchymosis.  There is positive bilateral varus abnormality.  The patient is non-tender along the lateral joint line, and is bilaterally-tender along the medial joint line.  The patient has full knee flexion and extension. There is flexion and extension discomfort with range of motion exercises.  The patient has a negative rotational Mcmurray test.  There is positive retropatellar discomfort.  The patient has a negative patella stretch test.  The patient has a negative varus stress test and a negative valgus stress test, in looking for stability.  The patient has a negative Lachman's  test.  Vascular: The patient has a negative Toula' test bilaterally.  The patient had a normal dorsalis pedis and posterior tibial pulse.  There is normal skin warmth.  There is normal capillary refill bilaterally.    Neurologic: The patient has a negative straight leg raise.  The patient has normal muscle strength testing for the quadriceps, calves, ankle dorsiflexion, ankle plantarflexion, and extensor hallicus longus.  The patient has sensation that is intact to light touch.  The deep tendon reflexes are normal at the patella and achilles.  No clonus is noted.    Radiology: Xrays of the right knee were ordered and interpreted 10/28/2023, with 4 views using AP, PA flexed view, lateral, and patella views.  Xrays revealed the right knee with medial compartment complete collapse with bone-on-bone pathology.  There is early sclerotic changes noted.  There is osteophyte formation.  The left knee shows similar findings with medial compartment complete collapse.  There is patellofemoral lateral osteophyte formation with degenerative narrowing bilaterally.    Radiology: Xrays of the left knee were ordered and interpreted 10/28/2023, with 1 views using lateral views.  Xrays revealed left knee with patellofemoral osteophyte formation and degenerative narrowing.     Impression Encounter Diagnoses  Name Primary?  . Primary osteoarthritis of right knee Yes  . Primary osteoarthritis of left knee   . Chronic pain of both knees   . Adult BMI 34.0-34.9 kg/sq m     Plan   1.  I discussed the physical exam findings as well as his x-ray results and previous treatment.  We discussed with his care in the past and previous injections.  He is not interested in another set of injections today. 2.  The patient may need a total knee replacement bilaterally. 3.  He is on diclofenac and we will continue this twice a day. 4.  We will send in tramadol for breakthrough pain. 5.  The patient will continue his normal  work activities until seen by Dr. Lorelle. 6.  The patient will follow-up in the next week or so to discuss total knee replacement with Dr. Lorelle.  This note was generated in part with voice recognition software and I apologize for any typographical errors that were not detected and corrected.  Review of the [place]  CSRS was performed in accordance with the [place] [facility] prior to dispensing any controlled substances.   Dorn Krystal Doyne PA-C, [email or web address]

## 2023-11-01 ENCOUNTER — Ambulatory Visit (INDEPENDENT_AMBULATORY_CARE_PROVIDER_SITE_OTHER): Payer: Self-pay | Admitting: Vascular Surgery

## 2023-11-01 VITALS — BP 124/81 | HR 66 | Wt 271.5 lb

## 2023-11-01 DIAGNOSIS — E785 Hyperlipidemia, unspecified: Secondary | ICD-10-CM | POA: Diagnosis not present

## 2023-11-01 DIAGNOSIS — M7989 Other specified soft tissue disorders: Secondary | ICD-10-CM

## 2023-11-01 DIAGNOSIS — I10 Essential (primary) hypertension: Secondary | ICD-10-CM

## 2023-11-01 NOTE — Assessment & Plan Note (Signed)
 lipid control important in reducing the progression of atherosclerotic disease. Continue statin therapy

## 2023-11-01 NOTE — Assessment & Plan Note (Signed)
 blood pressure control important in reducing the progression of atherosclerotic disease. On appropriate oral medications.

## 2023-11-01 NOTE — Assessment & Plan Note (Signed)
 Recommend:  I have had a long discussion with the patient regarding swelling and why it  causes symptoms.  Patient will begin wearing graduated compression on a daily basis a prescription was given. The patient will  wear the stockings first thing in the morning and removing them in the evening. The patient is instructed specifically not to sleep in the stockings.   In addition, behavioral modification will be initiated.  This will include frequent elevation, use of over the counter pain medications and exercise such as walking.  Consideration for a lymph pump will also be made based upon the effectiveness of conservative therapy.  This would help to improve the edema control and prevent sequela such as ulcers and infections   Patient should undergo duplex ultrasound of the venous system to ensure that DVT or reflux is not present.  Given his history of DVT and previous PE, I think there is likely a significant postphlebitic component to his symptoms.  The patient will follow-up with me after the ultrasound.

## 2023-11-01 NOTE — Progress Notes (Signed)
 Patient ID: Darrell Darrell, male   DOB: 1965/08/13, 58 y.o.   MRN: [id number]  Chief Complaint  Patient presents with   Establish Care    HPI Darrell Darrell is a 58 y.o. male.  I am asked to see the patient by Dr. Gordan in the [facility] ER for evaluation of worsening lower extremity swelling.  This predominantly affects the left lower extremity and has been present for many months.  The right lower extremity is now swelling as well.  He has been trying to elevate his legs but is still noticed worsening swelling.  He works fairly long hours.  He also has significant arthritic knee pain bilaterally which seems to exacerbate the pain and swelling in his legs.  No current open wounds but he says he has developed several scabs on the left leg that are slow to heal.  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  He was appropriately anticoagulated at the time but is no longer on any anticoagulants.     Past Medical History:  Diagnosis Date   Hypertension   Hyperlipidemia History of DVT and PE Osteoarthritis of the knees  No past surgical history on file.   No family history on file.    Social History   Tobacco Use   Smoking status: Never   Smokeless tobacco: Never  Vaping Use   Vaping status: Never Used  Substance Use Topics   Alcohol use: Yes     No Known Allergies  Current Outpatient Medications  Medication Sig Dispense Refill   atorvastatin (LIPITOR) 40 MG tablet Take by mouth.     hydrochlorothiazide (HYDRODIURIL) 25 MG tablet      Colchicine (MITIGARE) 0.6 MG CAPS      lisinopril (ZESTRIL) 10 MG tablet      meclizine  (ANTIVERT ) 25 MG tablet Take 1 tablet (25 mg total) by mouth 3 (three) times daily as needed for dizziness. 30 tablet 0   No current facility-administered medications for this visit.      REVIEW OF SYSTEMS (Negative unless checked)  Constitutional: [] Weight loss  [] Fever  [] Chills Cardiac: [] Chest pain   [] Chest  pressure   [] Palpitations   [] Shortness of breath when laying flat   [] Shortness of breath at rest   [] Shortness of breath with exertion. Vascular:  [] Pain in legs with walking   [] Pain in legs at rest   [] Pain in legs when laying flat   [] Claudication   [] Pain in feet when walking  [] Pain in feet at rest  [] Pain in feet when laying flat   [x] History of DVT   [] Phlebitis   [x] Swelling in legs   [] Varicose veins   [] Non-healing ulcers Pulmonary:   [] Uses home oxygen   [] Productive cough   [] Hemoptysis   [] Wheeze  [] COPD   [] Asthma Neurologic:  [] Dizziness  [] Blackouts   [] Seizures   [] History of stroke   [] History of TIA  [] Aphasia   [] Temporary blindness   [] Dysphagia   [] Weakness or numbness in arms   [] Weakness or numbness in legs Musculoskeletal:  [x] Arthritis   [] Joint swelling   [x] Joint pain   [] Low back pain Hematologic:  [] Easy bruising  [] Easy bleeding   [] Hypercoagulable state   [] Anemic  [] Hepatitis Gastrointestinal:  [] Blood in stool   [] Vomiting blood  [] Gastroesophageal reflux/heartburn   [] Abdominal pain Genitourinary:  [] Chronic kidney disease   [] Difficult urination  [] Frequent urination  [] Burning with urination   [] Hematuria Skin:  []   Rashes   [] Ulcers   [] Wounds Psychological:  [] History of anxiety   []  History of major depression.    Physical Exam BP 124/81   Pulse 66   Wt 271 lb 8 oz (123.2 kg)   BMI 42.52 kg/m  Gen:  WD/WN, NAD. Obese  Head: [place]/AT, No temporalis wasting.  Ear/Nose/Throat: Hearing grossly intact, nares w/o erythema or drainage, oropharynx w/o Erythema/Exudate Eyes: Conjunctiva clear, sclera non-icteric  Neck: trachea midline.  No JVD.  Pulmonary:  Good air movement, respirations not labored, no use of accessory muscles  Cardiac: RRR, no JVD Vascular:  Vessel Right Left  Radial Palpable Palpable                          PT 1+ trace  DP 1+ 1+    Gastrointestinal:. No masses, surgical incisions, or scars. Musculoskeletal: M/S 5/5 throughout.   Extremities without ischemic changes.  No deformity or atrophy. 1+ RLE edema, 1-2+ LLE edema. Neurologic: Sensation grossly intact in extremities.  Symmetrical.  Speech is fluent. Motor exam as listed above. Psychiatric: Judgment intact, Mood & affect appropriate for pt's clinical situation. Dermatologic: No rashes or ulcers noted.  No cellulitis or open wounds.    Radiology No results found.  Labs Recent Results (from the past 2160 hours)  CBC with Differential     Status: Abnormal   Collection Time: 10/14/23  8:30 PM  Result Value Ref Range   WBC 7.8 4.0 - 10.5 K/uL   RBC 4.61 4.22 - 5.81 MIL/uL   Hemoglobin 14.5 13.0 - 17.0 g/dL   HCT 55.8 60.9 - 47.9 %   MCV 95.7 80.0 - 100.0 fL   MCH 31.5 26.0 - 34.0 pg   MCHC 32.9 30.0 - 36.0 g/dL   RDW 86.6 88.4 - 84.4 %   Platelets 230 150 - 400 K/uL   nRBC 0.0 0.0 - 0.2 %   Neutrophils Relative % 50 %   Neutro Abs 3.9 1.7 - 7.7 K/uL   Lymphocytes Relative 28 %   Lymphs Abs 2.2 0.7 - 4.0 K/uL   Monocytes Relative 12 %   Monocytes Absolute 0.9 0.1 - 1.0 K/uL   Eosinophils Relative 6 %   Eosinophils Absolute 0.5 0.0 - 0.5 K/uL   Basophils Relative 1 %   Basophils Absolute 0.1 0.0 - 0.1 K/uL   Immature Granulocytes 3 %   Abs Immature Granulocytes 0.27 (H) 0.00 - 0.07 K/uL    Comment: Performed at [facility], [address], [place], [place] [address]  Basic metabolic panel     Status: Abnormal   Collection Time: 10/14/23  8:30 PM  Result Value Ref Range   Sodium 139 135 - 145 mmol/L   Potassium 3.8 3.5 - 5.1 mmol/L   Chloride 103 98 - 111 mmol/L   CO2 25 22 - 32 mmol/L   Glucose, Bld 111 (H) 70 - 99 mg/dL    Comment: Glucose reference range applies only to samples taken after fasting for at least 8 hours.   BUN 29 (H) 6 - 20 mg/dL   Creatinine, Ser 9.15 0.61 - 1.24 mg/dL   Calcium 9.2 8.9 - 89.6 mg/dL   GFR, Estimated >39 >39 mL/min    Comment: (NOTE) Calculated using the CKD-EPI Creatinine Equation (2021)     Anion gap 11 5 - 15    Comment: Performed at [facility], [address] [place], [place] [address]    Assessment/Plan:  Swelling of limb Recommend:  I have had a long discussion with the patient regarding swelling and why it  causes symptoms.  Patient will begin wearing graduated compression on a daily basis a prescription was given. The patient will  wear the stockings first thing in the morning and removing them in the evening. The patient is instructed specifically not to sleep in the stockings.   In addition, behavioral modification will be initiated.  This will include frequent elevation, use of over the counter pain medications and exercise such as walking.  Consideration for a lymph pump will also be made based upon the effectiveness of conservative therapy.  This would help to improve the edema control and prevent sequela such as ulcers and infections   Patient should undergo duplex ultrasound of the venous system to ensure that DVT or reflux is not present.  Given his history of DVT and previous PE, I think there is likely a significant postphlebitic component to his symptoms.  The patient will follow-up with me after the ultrasound.   Hypertension blood pressure control important in reducing the progression of atherosclerotic disease. On appropriate oral medications.   Hyperlipidemia lipid control important in reducing the progression of atherosclerotic disease. Continue statin therapy      Selinda Gu 11/01/2023, 4:38 PM   This note was created with Dragon medical transcription system.  Any errors from dictation are unintentional.

## 2023-12-06 ENCOUNTER — Ambulatory Visit (INDEPENDENT_AMBULATORY_CARE_PROVIDER_SITE_OTHER): Admitting: Vascular Surgery

## 2023-12-06 ENCOUNTER — Encounter (INDEPENDENT_AMBULATORY_CARE_PROVIDER_SITE_OTHER): Payer: Self-pay | Admitting: Vascular Surgery

## 2023-12-06 ENCOUNTER — Ambulatory Visit (INDEPENDENT_AMBULATORY_CARE_PROVIDER_SITE_OTHER)

## 2023-12-06 VITALS — BP 123/77 | HR 64 | Resp 16 | Ht 69.0 in | Wt 277.0 lb

## 2023-12-06 DIAGNOSIS — M7989 Other specified soft tissue disorders: Secondary | ICD-10-CM

## 2023-12-06 DIAGNOSIS — I10 Essential (primary) hypertension: Secondary | ICD-10-CM

## 2023-12-06 DIAGNOSIS — E785 Hyperlipidemia, unspecified: Secondary | ICD-10-CM | POA: Diagnosis not present

## 2023-12-06 DIAGNOSIS — I83893 Varicose veins of bilateral lower extremities with other complications: Secondary | ICD-10-CM | POA: Diagnosis not present

## 2023-12-06 NOTE — Assessment & Plan Note (Signed)
 Venous reflux study today demonstrates no evidence of deep venous thrombosis or superficial thrombophlebitis.  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  Recommend  I have reviewed my previous  discussion with the patient regarding  varicose veins and why they cause symptoms. Patient will continue  wearing graduated compression stockings class 1 on a daily basis, beginning first thing in the morning and removing them in the evening.  The patient is CEAP [id number].  The patient has been wearing compression for more than 12 weeks with no or little benefit.  The patient has been exercising daily for more than 12 weeks. The patient has been elevating and taking OTC pain medications for more than 12 weeks.  None of these have have eliminated the pain related to the varicose veins and venous reflux or the discomfort regarding venous congestion.    In addition, behavioral modification including elevation during the day was again discussed and this will continue.  The patient has utilized over the counter pain medications and has been exercising.  However, at this time conservative therapy has not alleviated the patient's symptoms of leg pain and swelling  Recommend: laser ablation of the right and  left great saphenous veins to eliminate the symptoms of pain and swelling of the lower extremities caused by the severe superficial venous reflux disease.  We will plan to do the left leg first since this is by far the more severely affected leg.  If he does well with left leg laser ablation, I think would be reasonable proceed with right great saphenous vein laser ablation as well.

## 2023-12-06 NOTE — Progress Notes (Signed)
 MRN : [id number]  Darrell Scott is a 58 y.o. (11/04/1965) male who presents with chief complaint of  Chief Complaint  Patient presents with   Follow-up    fu pt conv + Bilat Reflux  Pain in knees swelling both legs  .  History of Present Illness: Patient returns today in follow up of pain and swelling of the lower extremities.  He has been compliant with 20 to 30 mmHg compression socks, leg elevation, and exercise is much as possible.  Knee arthritis limits his exercise somewhat.  Despite appropriate conservative measures, he continues to have marked swelling particularly in the left lower extremity.  This is associated with significant stasis skin changes as well as pain and heaviness in the legs.  Again, the left lower extremity is more severely affected than the right. Venous reflux study today demonstrates no evidence of deep venous thrombosis or superficial thrombophlebitis.  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  Current Outpatient Medications  Medication Sig Dispense Refill   atorvastatin (LIPITOR) 40 MG tablet Take by mouth.     Colchicine (MITIGARE) 0.6 MG CAPS      hydrochlorothiazide (HYDRODIURIL) 25 MG tablet      lisinopril (ZESTRIL) 10 MG tablet      meclizine  (ANTIVERT ) 25 MG tablet Take 1 tablet (25 mg total) by mouth 3 (three) times daily as needed for dizziness. 30 tablet 0   No current facility-administered medications for this visit.    Past Medical History:  Diagnosis Date   Hypertension     No past surgical history on file.   Social History   Tobacco Use   Smoking status: Never   Smokeless tobacco: Never  Vaping Use   Vaping status: Never Used  Substance Use Topics   Alcohol use: Yes      No family history on file.   No Known Allergies     REVIEW OF SYSTEMS  (Negative unless checked)   Constitutional: [] Weight loss  [] Fever  [] Chills Cardiac: [] Chest pain   [] Chest pressure   [] Palpitations   [] Shortness of breath when laying flat   [] Shortness of breath at rest   [] Shortness of breath with exertion. Vascular:  [] Pain in legs with walking   [] Pain in legs at rest   [] Pain in legs when laying flat   [] Claudication   [] Pain in feet when walking  [] Pain in feet at rest  [] Pain in feet when laying flat   [x] History of DVT   [] Phlebitis   [x] Swelling in legs   [] Varicose veins   [] Non-healing ulcers Pulmonary:   [] Uses home oxygen   [] Productive cough   [] Hemoptysis   [] Wheeze  [] COPD   [] Asthma Neurologic:  [] Dizziness  [] Blackouts   [] Seizures   [] History of stroke   [] History of TIA  [] Aphasia   [] Temporary blindness   [] Dysphagia   [] Weakness or numbness in arms   [] Weakness or numbness in legs Musculoskeletal:  [x] Arthritis   [] Joint swelling   [x] Joint pain   [] Low back pain Hematologic:  [] Easy bruising  [] Easy bleeding   [] Hypercoagulable state   [] Anemic  [] Hepatitis Gastrointestinal:  [] Blood in stool   [] Vomiting blood  [] Gastroesophageal reflux/heartburn   [] Abdominal pain Genitourinary:  [] Chronic kidney disease   [] Difficult urination  [] Frequent urination  [] Burning with urination   [] Hematuria  Skin:  [] Rashes   [] Ulcers   [] Wounds Psychological:  [] History of anxiety   []  History of major depression.  Physical Examination  BP 123/77   Pulse 64   Resp 16   Ht 5' 9 (1.753 m)   Wt 277 lb (125.6 kg)   BMI 40.91 kg/m  Gen:  WD/WN, NAD Head: [place]/AT, No temporalis wasting. Ear/Nose/Throat: Hearing grossly intact, nares w/o erythema or drainage Eyes: Conjunctiva clear. Sclera non-icteric Neck: Supple.  Trachea midline Pulmonary:  Good air movement, no use of accessory muscles.  Cardiac: RRR, no JVD Vascular:  Vessel Right Left  Radial Palpable Palpable           Musculoskeletal: M/S 5/5 throughout.  No deformity or atrophy.  Mild  stasis dermatitis changes and trace to 1+ right lower extremity edema.  Marked stasis dermatitis changes with dry scaling skin and hyperpigmentation.  2+ left lower extremity edema. Neurologic: Sensation grossly intact in extremities.  Symmetrical.  Speech is fluent.  Psychiatric: Judgment intact, Mood & affect appropriate for pt's clinical situation. Dermatologic: No rashes or ulcers noted.  No cellulitis or open wounds.      Labs Recent Results (from the past 2160 hours)  CBC with Differential     Status: Abnormal   Collection Time: 10/14/23  8:30 PM  Result Value Ref Range   WBC 7.8 4.0 - 10.5 K/uL   RBC 4.61 4.22 - 5.81 MIL/uL   Hemoglobin 14.5 13.0 - 17.0 g/dL   HCT 55.8 60.9 - 47.9 %   MCV 95.7 80.0 - 100.0 fL   MCH 31.5 26.0 - 34.0 pg   MCHC 32.9 30.0 - 36.0 g/dL   RDW 86.6 88.4 - 84.4 %   Platelets 230 150 - 400 K/uL   nRBC 0.0 0.0 - 0.2 %   Neutrophils Relative % 50 %   Neutro Abs 3.9 1.7 - 7.7 K/uL   Lymphocytes Relative 28 %   Lymphs Abs 2.2 0.7 - 4.0 K/uL   Monocytes Relative 12 %   Monocytes Absolute 0.9 0.1 - 1.0 K/uL   Eosinophils Relative 6 %   Eosinophils Absolute 0.5 0.0 - 0.5 K/uL   Basophils Relative 1 %   Basophils Absolute 0.1 0.0 - 0.1 K/uL   Immature Granulocytes 3 %   Abs Immature Granulocytes 0.27 (H) 0.00 - 0.07 K/uL    Comment: Performed at [facility], [address], [place], [place] [address]  Basic metabolic panel     Status: Abnormal   Collection Time: 10/14/23  8:30 PM  Result Value Ref Range   Sodium 139 135 - 145 mmol/L   Potassium 3.8 3.5 - 5.1 mmol/L   Chloride 103 98 - 111 mmol/L   CO2 25 22 - 32 mmol/L   Glucose, Bld 111 (H) 70 - 99 mg/dL    Comment: Glucose reference range applies only to samples taken after fasting for at least 8 hours.   BUN 29 (H) 6 - 20 mg/dL   Creatinine, Ser 9.15 0.61 - 1.24 mg/dL   Calcium 9.2 8.9 - 89.6 mg/dL   GFR, Estimated >39 >39 mL/min    Comment: (NOTE) Calculated using the CKD-EPI  Creatinine Equation (2021)    Anion gap 11 5 - 15    Comment: Performed at [facility], [address] [place], [place] [address]    Radiology No results found.  Assessment/Plan  Varicose veins of leg with swelling, bilateral Venous reflux study today demonstrates no evidence of deep venous  thrombosis or superficial thrombophlebitis.  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  Recommend  I have reviewed my previous  discussion with the patient regarding  varicose veins and why they cause symptoms. Patient will continue  wearing graduated compression stockings class 1 on a daily basis, beginning first thing in the morning and removing them in the evening.  The patient is CEAP [id number].  The patient has been wearing compression for more than 12 weeks with no or little benefit.  The patient has been exercising daily for more than 12 weeks. The patient has been elevating and taking OTC pain medications for more than 12 weeks.  None of these have have eliminated the pain related to the varicose veins and venous reflux or the discomfort regarding venous congestion.    In addition, behavioral modification including elevation during the day was again discussed and this will continue.  The patient has utilized over the counter pain medications and has been exercising.  However, at this time conservative therapy has not alleviated the patient's symptoms of leg pain and swelling  Recommend: laser ablation of the right and  left great saphenous veins to eliminate the symptoms of pain and swelling of the lower extremities caused by the severe superficial venous reflux disease.  We will plan to do the left leg first since this is by far the more severely affected leg.  If he does well with left leg laser ablation, I  think would be reasonable proceed with right great saphenous vein laser ablation as well.  Hypertension blood pressure control important in reducing the progression of atherosclerotic disease. On appropriate oral medications.     Hyperlipidemia lipid control important in reducing the progression of atherosclerotic disease. Continue statin therapy  Selinda Gu, MD  12/06/2023 4:16 PM    This note was created with Dragon medical transcription system.  Any errors from dictation are purely unintentional

## 2023-12-19 ENCOUNTER — Telehealth (INDEPENDENT_AMBULATORY_CARE_PROVIDER_SITE_OTHER): Payer: Self-pay | Admitting: Vascular Surgery

## 2023-12-19 NOTE — Telephone Encounter (Signed)
 Pt is scheduled for 01/10/24 Left leg GSV laser ablation see jd.- no auth required. Please send prescription to pharmacy in pts chart.

## 2023-12-20 ENCOUNTER — Other Ambulatory Visit (INDEPENDENT_AMBULATORY_CARE_PROVIDER_SITE_OTHER): Payer: Self-pay

## 2023-12-20 MED ORDER — ALPRAZOLAM 0.5 MG PO TABS: ORAL_TABLET | ORAL | 0 refills | Status: AC

## 2023-12-21 ENCOUNTER — Telehealth (INDEPENDENT_AMBULATORY_CARE_PROVIDER_SITE_OTHER): Payer: Self-pay | Admitting: Vascular Surgery

## 2023-12-21 NOTE — Telephone Encounter (Signed)
 Pt paper work has been mailed for pt Laser on 01/10/24.

## 2024-01-05 ENCOUNTER — Other Ambulatory Visit (INDEPENDENT_AMBULATORY_CARE_PROVIDER_SITE_OTHER): Payer: Self-pay | Admitting: Vascular Surgery

## 2024-01-05 DIAGNOSIS — I83893 Varicose veins of bilateral lower extremities with other complications: Secondary | ICD-10-CM

## 2024-01-10 ENCOUNTER — Ambulatory Visit (INDEPENDENT_AMBULATORY_CARE_PROVIDER_SITE_OTHER): Admitting: Vascular Surgery

## 2024-01-10 VITALS — BP 115/69 | HR 68 | Resp 17 | Ht 69.0 in | Wt 256.0 lb

## 2024-01-10 DIAGNOSIS — I83893 Varicose veins of bilateral lower extremities with other complications: Secondary | ICD-10-CM | POA: Diagnosis not present

## 2024-01-10 NOTE — Progress Notes (Signed)
  Darrell Scott is a 58 y.o. male who presents with symptomatic venous reflux  Past Medical History:  Diagnosis Date   Hypertension     No past surgical history on file.   Current Outpatient Medications:    ALPRAZolam  (XANAX ) 0.5 MG tablet, Please take 1st tab one hour prior to your procedure and take the 2nd tab when you arrive at our office for your appointment, Disp: 2 tablet, Rfl: 0   atorvastatin (LIPITOR) 40 MG tablet, Take by mouth., Disp: , Rfl:    Colchicine (MITIGARE) 0.6 MG CAPS, , Disp: , Rfl:    hydrochlorothiazide (HYDRODIURIL) 25 MG tablet, , Disp: , Rfl:    lisinopril (ZESTRIL) 10 MG tablet, , Disp: , Rfl:    meclizine  (ANTIVERT ) 25 MG tablet, Take 1 tablet (25 mg total) by mouth 3 (three) times daily as needed for dizziness., Disp: 30 tablet, Rfl: 0  No Known Allergies   Varicose veins of leg with swelling, bilateral   PLAN: The patient's left lower extremity was sterilely prepped and draped. The ultrasound machine was used to visualize the saphenous vein throughout its course. A segment in the upper calf was selected for access. The saphenous vein was accessed without difficulty using ultrasound guidance with a micropuncture needle. A 0.018 wire was then placed beyond the saphenofemoral junction and the needle was removed. The 65 cm sheath was then placed over the wire and the wire and dilator were removed. The laser fiber was then placed through the sheath and its tip was placed approximately 4-5 centimeters below the saphenofemoral junction. Tumescent anesthesia was then created with a dilute lidocaine solution. Laser energy was then delivered with constant withdrawal of the sheath and laser fiber. Approximately 1596 joules of energy were delivered over a length of 33 centimeters using a 1470 Hz VenaCure machine at 7 W. Sterile dressings were placed. The patient tolerated the procedure well without obvious complications.   Follow-up in 1 week with post-laser duplex.

## 2024-01-17 ENCOUNTER — Other Ambulatory Visit (INDEPENDENT_AMBULATORY_CARE_PROVIDER_SITE_OTHER)

## 2024-01-17 DIAGNOSIS — I83893 Varicose veins of bilateral lower extremities with other complications: Secondary | ICD-10-CM

## 2024-02-07 ENCOUNTER — Ambulatory Visit (INDEPENDENT_AMBULATORY_CARE_PROVIDER_SITE_OTHER): Admitting: Vascular Surgery

## 2024-02-14 ENCOUNTER — Ambulatory Visit (INDEPENDENT_AMBULATORY_CARE_PROVIDER_SITE_OTHER): Admitting: Vascular Surgery

## 2024-02-14 ENCOUNTER — Encounter (INDEPENDENT_AMBULATORY_CARE_PROVIDER_SITE_OTHER): Payer: Self-pay | Admitting: Vascular Surgery

## 2024-02-14 VITALS — BP 157/96 | HR 68 | Resp 18 | Ht 67.0 in | Wt 272.4 lb

## 2024-02-14 DIAGNOSIS — E785 Hyperlipidemia, unspecified: Secondary | ICD-10-CM

## 2024-02-14 DIAGNOSIS — I10 Essential (primary) hypertension: Secondary | ICD-10-CM | POA: Diagnosis not present

## 2024-02-14 DIAGNOSIS — I83893 Varicose veins of bilateral lower extremities with other complications: Secondary | ICD-10-CM | POA: Diagnosis not present

## 2024-02-14 NOTE — Progress Notes (Unsigned)
 "   MRN : [id number]  Darrell Scott is a 59 y.o. (07-22-1965) male who presents with chief complaint of  Chief Complaint  Patient presents with   Follow-up    4 week follow up post laser   .  History of Present Illness: Patient returns today in follow up of ***  Discussed the use of AI scribe software for clinical note transcription with the patient, who gave verbal consent to proceed.  History of Present Illness     Results   Current Outpatient Medications  Medication Sig Dispense Refill   atorvastatin (LIPITOR) 40 MG tablet Take by mouth.     Colchicine (MITIGARE) 0.6 MG CAPS      hydrochlorothiazide (HYDRODIURIL) 25 MG tablet      lisinopril (ZESTRIL) 10 MG tablet      ALPRAZolam  (XANAX ) 0.5 MG tablet Please take 1st tab one hour prior to your procedure and take the 2nd tab when you arrive at our office for your appointment (Patient not taking: Reported on 02/14/2024) 2 tablet 0   meclizine  (ANTIVERT ) 25 MG tablet Take 1 tablet (25 mg total) by mouth 3 (three) times daily as needed for dizziness. (Patient not taking: Reported on 02/14/2024) 30 tablet 0   No current facility-administered medications for this visit.    Past Medical History:  Diagnosis Date   Hypertension     History reviewed. No pertinent surgical history.   Social History[1]    History reviewed. No pertinent family history.   Allergies[2]   REVIEW OF SYSTEMS (Negative unless checked)  Constitutional: [] Weight loss  [] Fever  [] Chills Cardiac: [] Chest pain   [] Chest pressure   [] Palpitations   [] Shortness of breath when laying flat   [] Shortness of breath at rest   [] Shortness of breath with exertion. Vascular:  [] Pain in legs with walking   [] Pain in legs at rest   [] Pain in legs when laying flat   [] Claudication   [] Pain in feet when walking  [] Pain in feet at rest  [] Pain in feet when laying flat   [] History of DVT   [] Phlebitis   [x] Swelling in legs   [x] Varicose veins   [] Non-healing  ulcers Pulmonary:   [] Uses home oxygen   [] Productive cough   [] Hemoptysis   [] Wheeze  [] COPD   [] Asthma Neurologic:  [] Dizziness  [] Blackouts   [] Seizures   [] History of stroke   [] History of TIA  [] Aphasia   [] Temporary blindness   [] Dysphagia   [] Weakness or numbness in arms   [] Weakness or numbness in legs Musculoskeletal:  [x] Arthritis   [] Joint swelling   [] Joint pain   [] Low back pain Hematologic:  [] Easy bruising  [] Easy bleeding   [] Hypercoagulable state   [] Anemic   Gastrointestinal:  [] Blood in stool   [] Vomiting blood  [] Gastroesophageal reflux/heartburn   [] Abdominal pain Genitourinary:  [] Chronic kidney disease   [] Difficult urination  [] Frequent urination  [] Burning with urination   [] Hematuria Skin:  [] Rashes   [] Ulcers   [] Wounds Psychological:  [] History of anxiety   []  History of major depression.  Physical Examination  BP (!) 157/96 (BP Location: Left Arm, Patient Position: Sitting, Cuff Size: Normal)   Pulse 68   Resp 18   Ht 5' 7 (1.702 m)   Wt 272 lb 6.4 oz (123.6 kg)   BMI 42.66 kg/m  Gen:  WD/WN, NAD Head: [place]/AT, No temporalis wasting. Ear/Nose/Throat: Hearing grossly intact, nares w/o erythema or drainage Eyes: Conjunctiva clear. Sclera non-icteric Neck: Supple.  Trachea midline Pulmonary:  Good air movement, no use of accessory muscles.  Cardiac: RRR, no JVD Vascular:  Vessel Right Left  Radial Palpable Palpable               Musculoskeletal: M/S 5/5 throughout.  No deformity or atrophy. *** edema. Neurologic: Sensation grossly intact in extremities.  Symmetrical.  Speech is fluent.  Psychiatric: Judgment intact, Mood & affect appropriate for pt's clinical situation. Dermatologic: No rashes or ulcers noted.  No cellulitis or open wounds.  Physical Exam     Labs No results found for this or any previous visit (from the past 2160 hours).  Radiology VAS US  LOWER EXTREMITY VENOUS POST ABLATION Result Date: 01/18/2024  Lower Venous Study Patient  Name:  Darrell Scott  Date of Exam:   01/17/2024 Medical Rec #: [id number]    Accession #:    [id number] Date of Birth: 1965-06-24   Patient Gender: M Patient Age:   70 years Exam Location:  [facility] Procedure:      VAS US  LASER ABLATION OF SUPERFICIAL VEIN Referring Phys: SELINDA GU --------------------------------------------------------------------------------  Other Indications: S/P Left GSV ablation. Performing Technologist: Jerel Croak RVT  Examination Guidelines: A complete evaluation includes B-mode imaging, spectral Doppler, color Doppler, and power Doppler as needed of all accessible portions of each vessel. Bilateral testing is considered an integral part of a complete examination. Limited examinations for reoccurring indications may be performed as noted.    Summary: Left: No evidence of deep vein thrombosis in the lower extremity. No indirect evidence of obstruction proximal to the inguinal ligament. GSV closed within 2.67cm from [facility] s/p ablation  *See table(s) above for measurements and observations. Electronically signed by Selinda Gu MD on 01/18/2024 at 8:58:30 AM.    Final     Assessment/Plan  No problem-specific Assessment & Plan notes found for this encounter.  Assessment & Plan   Hypertension blood pressure control important in reducing the progression of atherosclerotic disease. On appropriate oral medications.     Hyperlipidemia lipid control important in reducing the progression of atherosclerotic disease. Continue statin therapy   Selinda Gu, MD  02/14/2024 3:41 PM    This note was created with Dragon medical transcription system.  Any errors from dictation are purely unintentional    [1]  Social History Tobacco Use   Smoking status: Never   Smokeless tobacco: Never  Vaping Use   Vaping status: Never Used  Substance Use Topics   Alcohol use: Yes  [2] No Known Allergies  "
